# Patient Record
Sex: Male | Born: 1965 | Race: Black or African American | Hispanic: No | Marital: Married | Smoking: Never smoker
Health system: Southern US, Community
[De-identification: ages and names within clinical notes are randomized; demographics above are authoritative.]

## PROBLEM LIST (undated history)

## (undated) DIAGNOSIS — I10 Essential (primary) hypertension: Secondary | ICD-10-CM

## (undated) HISTORY — PX: KNEE SURGERY: SHX244

---

## 2016-10-31 ENCOUNTER — Emergency Department (HOSPITAL_COMMUNITY): Payer: BLUE CROSS/BLUE SHIELD

## 2016-10-31 ENCOUNTER — Emergency Department (HOSPITAL_COMMUNITY)
Admission: EM | Admit: 2016-10-31 | Discharge: 2016-11-01 | Disposition: A | Discharge status: Home / Self Care | Payer: BLUE CROSS/BLUE SHIELD | Attending: Emergency Medicine | Admitting: Emergency Medicine

## 2016-10-31 ENCOUNTER — Encounter (HOSPITAL_COMMUNITY): Payer: Self-pay

## 2016-10-31 ENCOUNTER — Emergency Department (HOSPITAL_COMMUNITY)
Admission: EM | Admit: 2016-10-31 | Discharge: 2016-10-31 | Disposition: A | Discharge status: Home / Self Care | Payer: Self-pay | Attending: Emergency Medicine | Admitting: Emergency Medicine

## 2016-10-31 ENCOUNTER — Encounter (HOSPITAL_COMMUNITY): Payer: Self-pay | Admitting: Emergency Medicine

## 2016-10-31 DIAGNOSIS — R791 Abnormal coagulation profile: Secondary | ICD-10-CM | POA: Insufficient documentation

## 2016-10-31 DIAGNOSIS — R200 Anesthesia of skin: Secondary | ICD-10-CM | POA: Insufficient documentation

## 2016-10-31 DIAGNOSIS — Z5321 Procedure and treatment not carried out due to patient leaving prior to being seen by health care provider: Secondary | ICD-10-CM | POA: Insufficient documentation

## 2016-10-31 DIAGNOSIS — R062 Wheezing: Secondary | ICD-10-CM | POA: Insufficient documentation

## 2016-10-31 DIAGNOSIS — R52 Pain, unspecified: Secondary | ICD-10-CM | POA: Diagnosis not present

## 2016-10-31 DIAGNOSIS — Z7982 Long term (current) use of aspirin: Secondary | ICD-10-CM | POA: Insufficient documentation

## 2016-10-31 DIAGNOSIS — R930 Abnormal findings on diagnostic imaging of skull and head, not elsewhere classified: Secondary | ICD-10-CM | POA: Diagnosis not present

## 2016-10-31 DIAGNOSIS — R6889 Other general symptoms and signs: Secondary | ICD-10-CM

## 2016-10-31 DIAGNOSIS — I10 Essential (primary) hypertension: Secondary | ICD-10-CM | POA: Insufficient documentation

## 2016-10-31 DIAGNOSIS — R110 Nausea: Secondary | ICD-10-CM | POA: Insufficient documentation

## 2016-10-31 DIAGNOSIS — R109 Unspecified abdominal pain: Secondary | ICD-10-CM | POA: Insufficient documentation

## 2016-10-31 HISTORY — DX: Essential (primary) hypertension: I10

## 2016-10-31 LAB — I-STAT CHEM 8, ED
BUN: 11 mg/dL (ref 6–20)
CALCIUM ION: 1.13 mmol/L — AB (ref 1.15–1.40)
CHLORIDE: 100 mmol/L — AB (ref 101–111)
Creatinine, Ser: 1.1 mg/dL (ref 0.61–1.24)
Glucose, Bld: 107 mg/dL — ABNORMAL HIGH (ref 65–99)
HCT: 43 % (ref 39.0–52.0)
Hemoglobin: 14.6 g/dL (ref 13.0–17.0)
Potassium: 3.9 mmol/L (ref 3.5–5.1)
Sodium: 140 mmol/L (ref 135–145)
TCO2: 30 mmol/L (ref 0–100)

## 2016-10-31 LAB — COMPREHENSIVE METABOLIC PANEL
ALBUMIN: 4.4 g/dL (ref 3.5–5.0)
ALT: 20 U/L (ref 17–63)
ALT: 20 U/L (ref 17–63)
ANION GAP: 12 (ref 5–15)
AST: 22 U/L (ref 15–41)
AST: 20 U/L (ref 15–41)
Albumin: 4.3 g/dL (ref 3.5–5.0)
Alkaline Phosphatase: 58 U/L (ref 38–126)
Alkaline Phosphatase: 56 U/L (ref 38–126)
Anion gap: 8 (ref 5–15)
BUN: 9 mg/dL (ref 6–20)
BUN: 13 mg/dL (ref 6–20)
CHLORIDE: 101 mmol/L (ref 101–111)
CHLORIDE: 103 mmol/L (ref 101–111)
CO2: 25 mmol/L (ref 22–32)
CO2: 28 mmol/L (ref 22–32)
CREATININE: 1.08 mg/dL (ref 0.61–1.24)
Calcium: 9.1 mg/dL (ref 8.9–10.3)
Calcium: 8.9 mg/dL (ref 8.9–10.3)
Creatinine, Ser: 1.14 mg/dL (ref 0.61–1.24)
GFR calc Af Amer: >60 mL/min (ref >60)
GFR calc non Af Amer: >60 mL/min (ref >60)
GFR calc non Af Amer: >60 mL/min (ref >60)
GLUCOSE: 107 mg/dL — AB (ref 65–99)
Glucose, Bld: 144 mg/dL — ABNORMAL HIGH (ref 65–99)
POTASSIUM: 3.3 mmol/L — AB (ref 3.5–5.1)
Potassium: 3.8 mmol/L (ref 3.5–5.1)
Sodium: 138 mmol/L (ref 135–145)
Sodium: 139 mmol/L (ref 135–145)
Total Bilirubin: 0.8 mg/dL (ref 0.3–1.2)
Total Bilirubin: 1.3 mg/dL — ABNORMAL HIGH (ref 0.3–1.2)
Total Protein: 7.1 g/dL (ref 6.5–8.1)
Total Protein: 7.1 g/dL (ref 6.5–8.1)

## 2016-10-31 LAB — CBC
HEMATOCRIT: 41 % (ref 39.0–52.0)
HEMATOCRIT: 39.4 % (ref 39.0–52.0)
HEMOGLOBIN: 13.5 g/dL (ref 13.0–17.0)
Hemoglobin: 13.9 g/dL (ref 13.0–17.0)
MCH: 29.8 pg (ref 26.0–34.0)
MCH: 30 pg (ref 26.0–34.0)
MCHC: 33.9 g/dL (ref 30.0–36.0)
MCHC: 34.3 g/dL (ref 30.0–36.0)
MCV: 88 fL (ref 78.0–100.0)
MCV: 87.6 fL (ref 78.0–100.0)
Platelets: 303 10*3/uL (ref 150–400)
Platelets: 288 10*3/uL (ref 150–400)
RBC: 4.66 MIL/uL (ref 4.22–5.81)
RBC: 4.5 MIL/uL (ref 4.22–5.81)
RDW: 12.9 % (ref 11.5–15.5)
RDW: 13.1 % (ref 11.5–15.5)
WBC: 9.9 10*3/uL (ref 4.0–10.5)
WBC: 7.4 10*3/uL (ref 4.0–10.5)

## 2016-10-31 LAB — I-STAT TROPONIN, ED: TROPONIN I, POC: 0 ng/mL (ref 0.00–0.08)

## 2016-10-31 LAB — DIFFERENTIAL
BASOS ABS: 0 10*3/uL (ref 0.0–0.1)
BASOS PCT: 0 %
Eosinophils Absolute: 0 10*3/uL (ref 0.0–0.7)
Eosinophils Relative: 1 %
LYMPHS ABS: 0.6 10*3/uL — AB (ref 0.7–4.0)
Lymphocytes Relative: 8 %
MONOS PCT: 8 %
Monocytes Absolute: 0.6 10*3/uL (ref 0.1–1.0)
NEUTROS ABS: 6.1 10*3/uL (ref 1.7–7.7)
NEUTROS PCT: 83 %

## 2016-10-31 LAB — APTT: aPTT: 27 seconds (ref 24–36)

## 2016-10-31 LAB — PROTIME-INR
INR: 0.98
Prothrombin Time: 13 seconds (ref 11.4–15.2)

## 2016-10-31 LAB — LIPASE, BLOOD: LIPASE: 21 U/L (ref 11–51)

## 2016-10-31 MED ORDER — ACETAMINOPHEN 325 MG PO TABS
650.0000 mg | ORAL_TABLET | Freq: Once | ORAL | Status: AC
  Administered 2016-10-31: 650 mg via ORAL
  Filled 2016-10-31: qty 2

## 2016-10-31 MED ORDER — SODIUM CHLORIDE 0.9 % IV BOLUS (SEPSIS): 1000.0000 mL | Freq: Once | INTRAVENOUS | Status: DC

## 2016-10-31 MED ORDER — ONDANSETRON 4 MG PO TBDP
ORAL_TABLET | ORAL | Status: AC
  Filled 2016-10-31: qty 1

## 2016-10-31 MED ORDER — IPRATROPIUM-ALBUTEROL 0.5-2.5 (3) MG/3ML IN SOLN
3.0000 mL | Freq: Once | RESPIRATORY_TRACT | Status: AC
  Administered 2016-10-31: 3 mL via RESPIRATORY_TRACT
  Filled 2016-10-31: qty 3

## 2016-10-31 MED ORDER — ONDANSETRON 4 MG PO TBDP
4.0000 mg | ORAL_TABLET | Freq: Once | ORAL | Status: AC | PRN
  Administered 2016-10-31: 4 mg via ORAL

## 2016-10-31 NOTE — ED Notes (Signed)
Pt states his wife is going to take him home due to wait times; pt was informed that if he feels he is getting worse to come back; pt seen leaving Ed with family

## 2016-10-31 NOTE — ED Notes (Signed)
Spoke with Ranae PalmsYelverton; made aware of pt status/complaint.

## 2016-10-31 NOTE — ED Triage Notes (Signed)
Pt complaining of numbness in bilateral hands. Pt with no focal neuro deficits at triage. Pt complaining of abdominal pain and nausea x 30 mins. Pt denies any chest pain. Pt denies any vomiting or diarrhea.

## 2016-10-31 NOTE — ED Triage Notes (Addendum)
Pt complaint of sudden onset slurred speech, unable to straighten fingers, left side facial droop, chills, nausea and generalized aches at 0200 today. Pt febrile with triage and verbalizes generalized aches/weakness and nausea. Pt denies numbness. Pt/wife verbalizes speech, inability to straighten fingers, and left sided facial droop  has since resolved. [facility] but left related to wait time.

## 2016-10-31 NOTE — ED Provider Notes (Signed)
[facility]-EMERGENCY DEPT Provider Note   CSN: [id number] Arrival date & time: 10/31/16  1054 By signing my name below, I, Levon HedgerElizabeth Hall, attest that this documentation has been prepared under the direction and in the presence of non-physician practitioner, Terance HartKelly Markita Stcharles, PA-C. Electronically Signed: Levon HedgerElizabeth Hall, Scribe. 10/31/2016. 10:05 PM.   History   Chief Complaint Chief Complaint  Patient presents with  . Aphasia  . Flu Like Symptoms   HPI Brian Carter is a 51 y.o. male with a hx of HTN who presents to the Emergency Department complaining of sudden onset, constant generalized body aches which began this morning at 2 am. Pt reports associated intermittent cough, SOB,  nausea,  headache, subjective fever (TMAX 100.2), and chills. [facility] and was given Zofran while in triage with relief of nausea; he left the ED without being seen by a provider due to wait times. No other alleviating or modifying factors noted.   Pt also complains of sudden onset, transient left sided facial asymmetry onset at 2 this AM. He reports associated numbness and tingling to bilateral hands, slurred speech, and lightheadedness. Pt's wife states these symptoms lasted about 20 minutes before resolving on their own. No alleviating or modifying factors noted.   He is a nonsmoker. Pt reports positive sick contact at home with his wife who had flu-like symptoms recently. He did not receive a flu shot this year. He is not followed by a PCP. Pt denies any vomiting, diarrhea, constipation, visual disturbance, gait difficulty or LOC.   The history is provided by the patient. No language interpreter was used.   Past Medical History:  Diagnosis Date  . Hypertension     There are no active problems to display for this patient.   History reviewed. No pertinent surgical history.  Home Medications    Prior to Admission medications   Medication Sig Start Date End Date Taking? Authorizing Provider    Aspirin-Salicylamide-Caffeine (BC HEADACHE POWDER PO) Take 1 packet by mouth daily as needed (headache).   Yes Historical Provider, MD   Family History No family history on file.  Social History Social History  Substance Use Topics  . Smoking status: Never Smoker  . Smokeless tobacco: Current User    Types: Snuff  . Alcohol use Yes    Allergies   Patient has no known allergies.   Review of Systems Review of Systems  Constitutional: Positive for chills and fever.  Eyes: Negative for visual disturbance.  Respiratory: Positive for cough.   Gastrointestinal: Positive for nausea. Negative for constipation, diarrhea and vomiting.  Musculoskeletal: Negative for gait problem.  Neurological: Positive for facial asymmetry, speech difficulty, weakness, light-headedness and headaches. Negative for syncope.  All other systems reviewed and are negative.  Physical Exam Updated Vital Signs BP 164/96 (BP Location: Left Arm)   Pulse 103   Temp 100 F (37.8 C) (Oral)   Resp 18   Ht 5\' 9"  (1.753 m)   Wt 189 lb (85.7 kg)   SpO2 94%   BMI 27.91 kg/m   Physical Exam  Constitutional: He is oriented to person, place, and time. He appears well-developed and well-nourished. No distress.  HENT:  Head: Normocephalic and atraumatic.  Right Ear: Hearing, tympanic membrane, external ear and ear canal normal.  Left Ear: Hearing, tympanic membrane, external ear and ear canal normal.  Nose: Nose normal.  Mouth/Throat: Uvula is midline, oropharynx is clear and moist and mucous membranes are normal.  Eyes: Conjunctivae are normal. Pupils are  equal, round, and reactive to light. Right eye exhibits no discharge. Left eye exhibits no discharge. No scleral icterus.  Neck: Normal range of motion.  Cardiovascular: Normal rate and regular rhythm.  Exam reveals no gallop and no friction rub.   No murmur heard. Pulmonary/Chest: Effort normal. No respiratory distress. He has wheezes. He has no rales. He  exhibits no tenderness.  Diffuse expiratory wheezes  Abdominal: Soft. Bowel sounds are normal. He exhibits no distension and no mass. There is tenderness. There is no rebound and no guarding. No hernia.  Mild generalized tenderness  Neurological: He is alert and oriented to person, place, and time.  Mental Status:  Alert, oriented, thought content appropriate, able to give a coherent history. Speech fluent without evidence of aphasia. Able to follow 2 step commands without difficulty.  Cranial Nerves:  II:  Peripheral visual fields grossly normal, pupils equal, round, reactive to light III,IV, VI: ptosis not present, extra-ocular motions intact bilaterally  V,VII: smile symmetric, facial light touch sensation equal VIII: hearing grossly normal to voice  X: uvula elevates symmetrically  XI: bilateral shoulder shrug symmetric and strong XII: midline tongue extension without fassiculations Motor:  Normal tone. 5/5 in upper and lower extremities bilaterally including strong and equal grip strength and dorsiflexion/plantar flexion Sensory: Pinprick and light touch normal in all extremities.  Cerebellar: normal finger-to-nose with bilateral upper extremities Gait: normal gait and balance CV: distal pulses palpable throughout    Skin: Skin is warm and dry.  Psychiatric: He has a normal mood and affect. His behavior is normal.  Nursing note and vitals reviewed.   ED Treatments / Results  DIAGNOSTIC STUDIES:  Oxygen Saturation is 94% on RA, low by my interpretation.    COORDINATION OF CARE:  10:00 PM Discussed treatment plan which includes CXR and breathing treatment with pt at bedside and pt agreed to plan.   Labs (all labs ordered are listed, but only abnormal results are displayed) Labs Reviewed  DIFFERENTIAL - Abnormal; Notable for the following:       Result Value   Lymphs Abs 0.6 (*)    All other components within normal limits  COMPREHENSIVE METABOLIC PANEL - Abnormal;  Notable for the following:    Glucose, Bld 107 (*)    Total Bilirubin 1.3 (*)    All other components within normal limits  I-STAT CHEM 8, ED - Abnormal; Notable for the following:    Chloride 100 (*)    Glucose, Bld 107 (*)    Calcium, Ion 1.13 (*)    All other components within normal limits  PROTIME-INR  APTT  CBC  I-STAT TROPOININ, ED    EKG  EKG Interpretation  Date/Time:  Monday October 31 2016 11:31:10 EST Ventricular Rate:  79 PR Interval:    QRS Duration: 92 QT Interval:  375 QTC Calculation: 430 R Axis:   87 Text Interpretation:  Sinus rhythm Probable LVH with secondary repol abnrm Baseline wander in lead(s) V1 V3 Confirmed by Ranae Palms  MD, DAVID ([id number]) on 10/31/2016 11:40:20 AM      Radiology Ct Head Wo Contrast  Result Date: 10/31/2016 CLINICAL DATA:  Frontal headache since yesterday. Bilateral hand weakness. EXAM: CT HEAD WITHOUT CONTRAST TECHNIQUE: Contiguous axial images were obtained from the base of the skull through the vertex without intravenous contrast. COMPARISON:  None. FINDINGS: Brain: No acute intracranial abnormality. Specifically, no hemorrhage, hydrocephalus, mass lesion, acute infarction, or significant intracranial injury. Vascular: No hyperdense vessel or unexpected calcification. Skull: No acute calvarial abnormality.  Sinuses/Orbits: Visualized paranasal sinuses and mastoids clear. Orbital soft tissues unremarkable. Other: None IMPRESSION: No intracranial abnormality. Electronically Signed   By: Charlett Nose M.D.   On: 10/31/2016 11:43   Procedures Procedures (including critical care time)  Medications Ordered in ED Medications  acetaminophen (TYLENOL) tablet 650 mg (650 mg Oral Given 10/31/16 2217)  ipratropium-albuterol (DUONEB) 0.5-2.5 (3) MG/3ML nebulizer solution 3 mL (3 mLs Nebulization Given 10/31/16 2221)    Initial Impression / Assessment and Plan / ED Course  I have reviewed the triage vital signs and the nursing notes.  Pertinent  labs & imaging results that were available during my care of the patient were reviewed by me and considered in my medical decision making (see chart for details).  51 year old male with flu-like symptoms and transient neuro symptoms which have resolved. He is febrile in the ED with documented fever of 101.5. He is also mildly tachcyardic which has resolved with Tylenol. BP is persistently elevated. Suspect some degree of untreated hypertension. He is not hypoxic. Labs overall unremarkable. CXR clear. EKG is SR. Troponin negative. CT negative. Duoneb given and repeat lung exam is improved. Neuro symptoms are atypical and possibly he was just very weak from being ill. Will d/c with close PCP follow up. Patient is NAD, non-toxic, with stable VS. Patient is informed of clinical course, understands medical decision making process, and agrees with plan. Opportunity for questions provided and all questions answered. Return precautions given.   Final Clinical Impressions(s) / ED Diagnoses   Final diagnoses:  Flu-like symptoms    New Prescriptions Discharge Medication List as of 11/01/2016 12:26 AM    START taking these medications   Details  albuterol (PROVENTIL HFA;VENTOLIN HFA) 108 (90 Base) MCG/ACT inhaler Inhale 1-2 puffs into the lungs every 6 (six) hours as needed for wheezing or shortness of breath., Starting Tue 11/01/2016, Print    ondansetron (ZOFRAN ODT) 4 MG disintegrating tablet Take 1 tablet (4 mg total) by mouth every 8 (eight) hours as needed for nausea or vomiting., Starting Tue 11/01/2016, Print       I personally performed the services described in this documentation, which was scribed in my presence. The recorded information has been reviewed and is accurate.    Bethel Born, PA-C 11/01/16 2059    Mancel Bale, MD 11/02/16 [phone]

## 2016-11-01 MED ORDER — ONDANSETRON 4 MG PO TBDP: 4.0000 mg | ORAL_TABLET | Freq: Three times a day (TID) | ORAL | 0 refills | Status: DC | PRN

## 2016-11-01 MED ORDER — ALBUTEROL SULFATE HFA 108 (90 BASE) MCG/ACT IN AERS: 1.0000 | INHALATION_SPRAY | Freq: Four times a day (QID) | RESPIRATORY_TRACT | 0 refills | Status: DC | PRN

## 2016-11-01 NOTE — Discharge Instructions (Signed)
Please follow up with a primary care doctor regarding your blood pressure Take Tylenol for fever Zofran for nausea Albuterol for shortness of breath/wheezing Return for worsening symptoms

## 2017-03-29 ENCOUNTER — Other Ambulatory Visit: Payer: Self-pay | Admitting: Internal Medicine

## 2017-03-29 ENCOUNTER — Ambulatory Visit
Admission: RE | Admit: 2017-03-29 | Discharge: 2017-03-29 | Disposition: A | Discharge status: Home / Self Care | Payer: BLUE CROSS/BLUE SHIELD | Source: Ambulatory Visit | Attending: Internal Medicine | Admitting: Internal Medicine

## 2017-03-29 DIAGNOSIS — M25522 Pain in left elbow: Secondary | ICD-10-CM

## 2018-03-20 IMAGING — CT CT HEAD W/O CM
3 series · 15 of 44 positions shown, 18 images · non-contrast
Comparison: None.

CLINICAL DATA: Frontal headache since yesterday. Bilateral hand
weakness.

EXAM:
CT HEAD WITHOUT CONTRAST
TECHNIQUE: Contiguous axial images were obtained from the base of the skull
through the vertex without intravenous contrast.

[Series 2: head wo · axial · 0.39mm/px · z∈[-86,+24]mm · 9 of 27 slices shown, 12 images]
[im 3/27  brain]
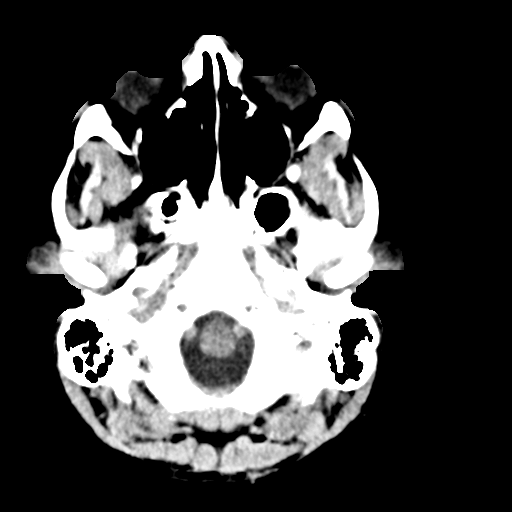
[im 3/27  bone]
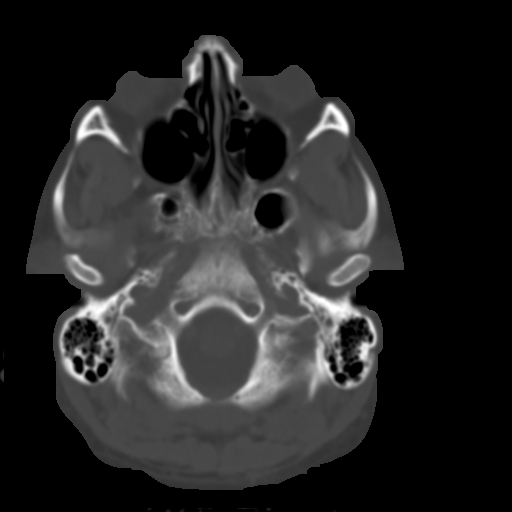
[im 6/27  brain]
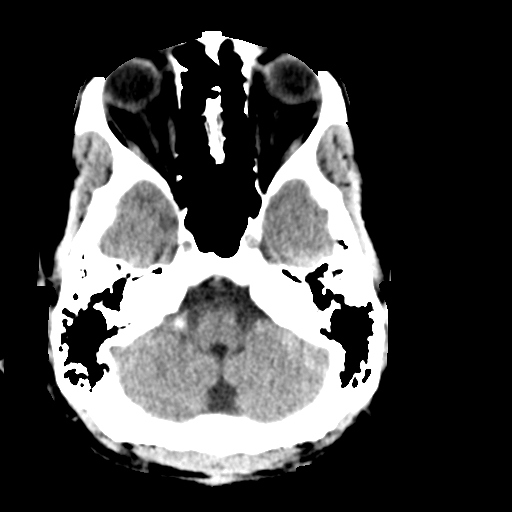
[im 8/27  brain]
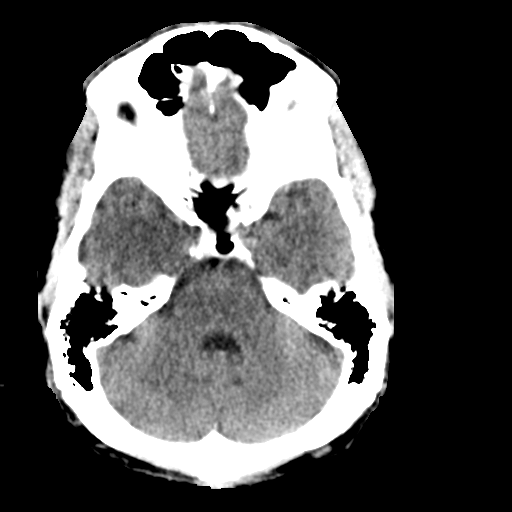
[im 11/27  brain]
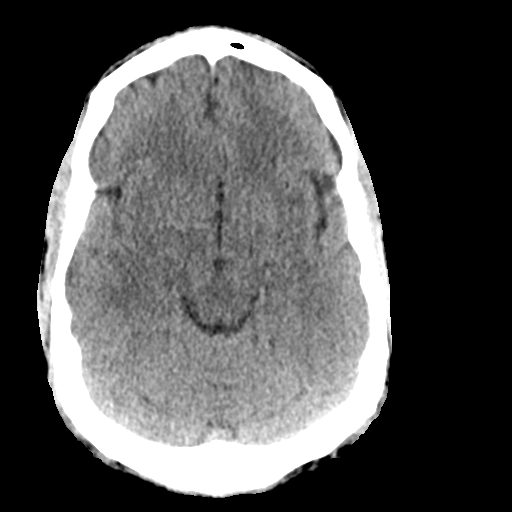
[im 14/27  brain]
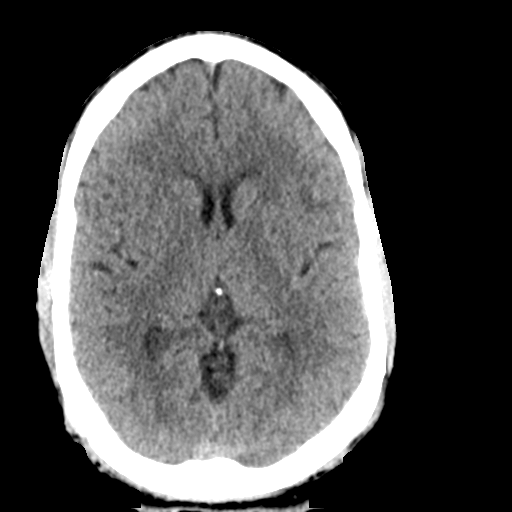
[im 14/27  bone]
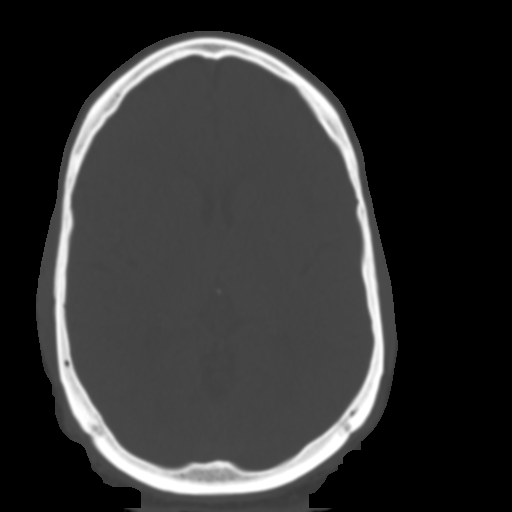
[im 17/27  brain]
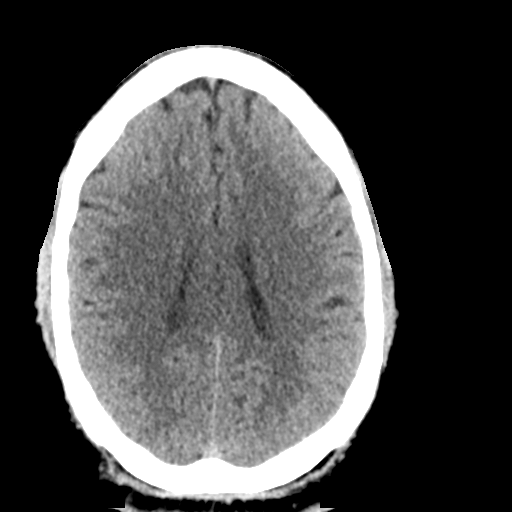
[im 20/27  brain]
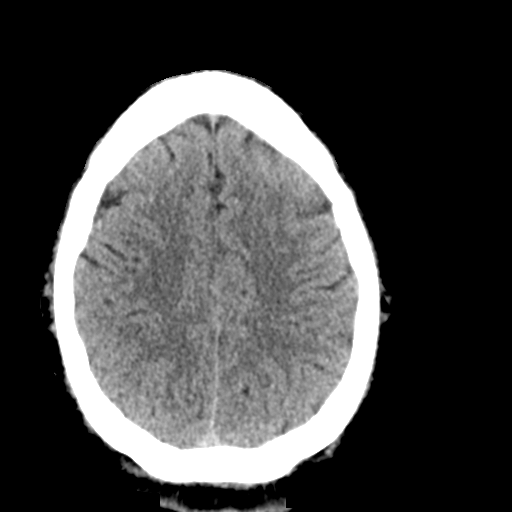
[im 22/27  brain]
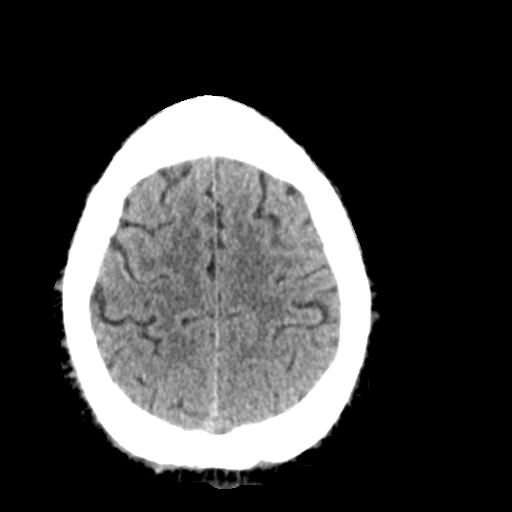
[im 25/27  brain]
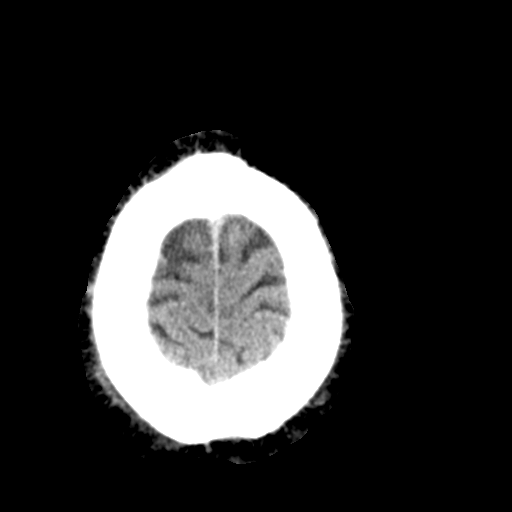
[im 25/27  bone]
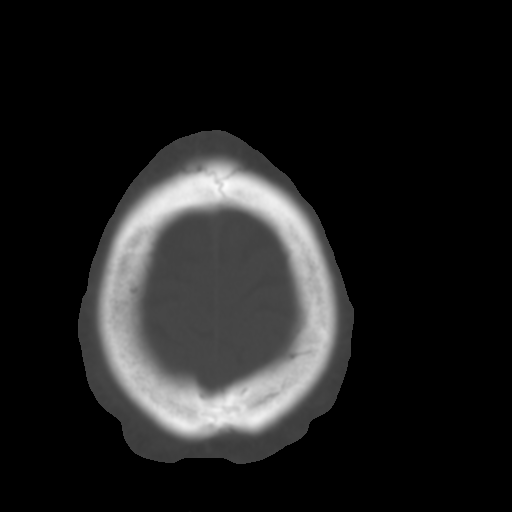

[Series 4: coronal soft tissue · coronal · 0.28mm/px · 3 of 67 slices shown]
[im 23/67  brain]
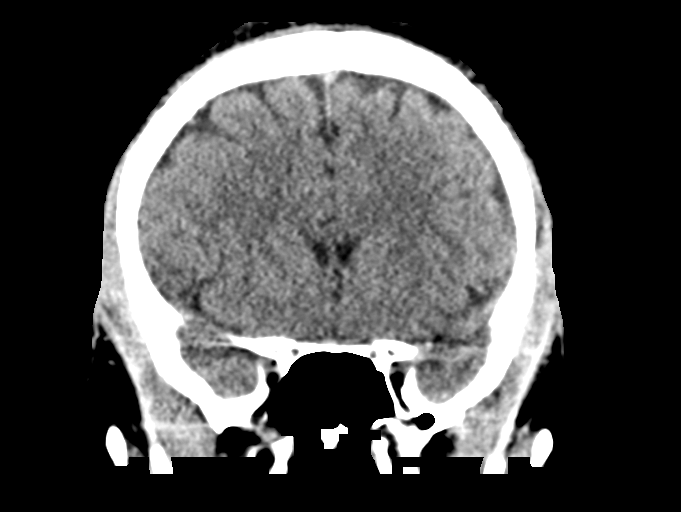
[im 30/67  brain]
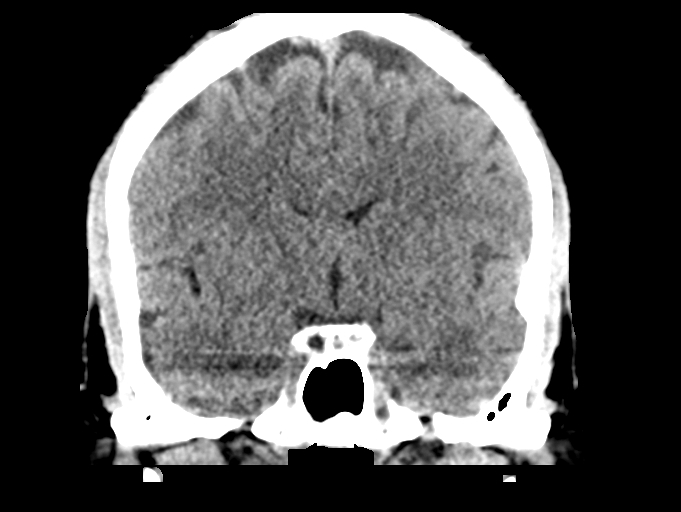
[im 37/67  brain]
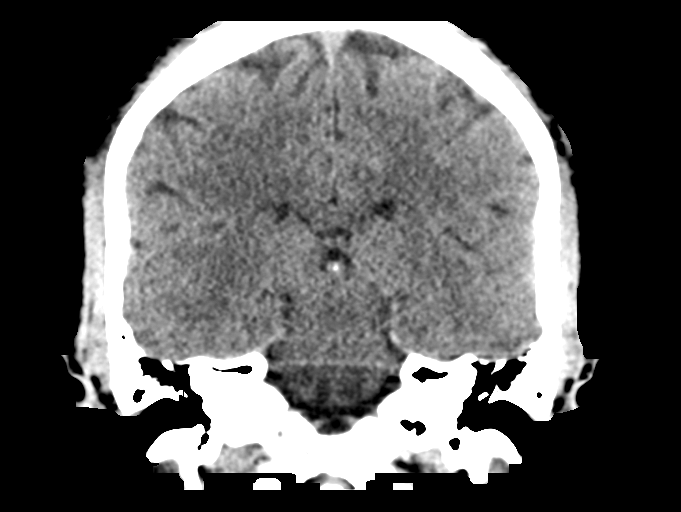

[Series 5: sagittal soft tissue · sagittal · 0.29mm/px · 3 of 58 slices shown]
[im 20/58  brain]
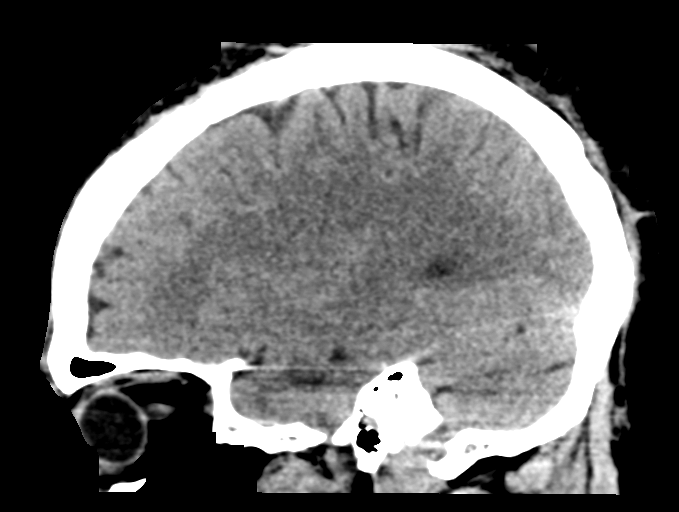
[im 29/58  brain]
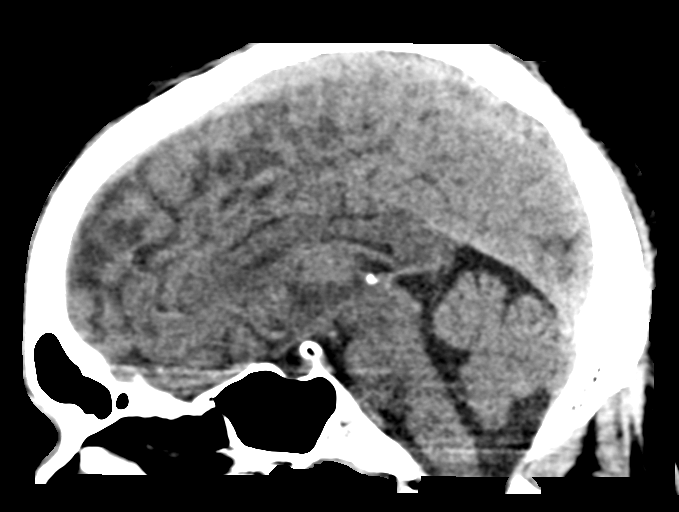
[im 39/58  brain]
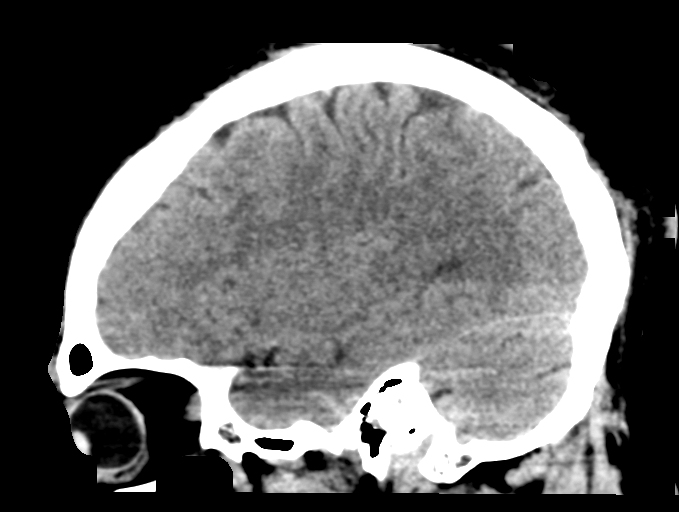

[15 of 44 positions shown; findings below may reference images not displayed]

FINDINGS: Brain: No acute intracranial abnormality. Specifically, no
hemorrhage, hydrocephalus, mass lesion, acute infarction, or
significant intracranial injury.

Vascular: No hyperdense vessel or unexpected calcification.

Skull: No acute calvarial abnormality.

Sinuses/Orbits: Visualized paranasal sinuses and mastoids clear.
Orbital soft tissues unremarkable.

Other: None
IMPRESSION: No intracranial abnormality.

## 2018-03-30 ENCOUNTER — Encounter: Payer: Self-pay | Admitting: Internal Medicine

## 2018-04-16 ENCOUNTER — Other Ambulatory Visit: Payer: Self-pay

## 2018-04-16 ENCOUNTER — Ambulatory Visit (AMBULATORY_SURGERY_CENTER): Payer: Self-pay

## 2018-04-16 VITALS — Ht 70.0 in | Wt 202.2 lb

## 2018-04-16 DIAGNOSIS — Z1211 Encounter for screening for malignant neoplasm of colon: Secondary | ICD-10-CM

## 2018-04-16 MED ORDER — NA SULFATE-K SULFATE-MG SULF 17.5-3.13-1.6 GM/177ML PO SOLN: 1.0000 | Freq: Once | ORAL | 0 refills | Status: AC

## 2018-04-16 NOTE — Progress Notes (Signed)
Denies allergies to eggs or soy products. Denies complication of anesthesia or sedation. Denies use of weight loss medication. Denies use of O2.   Emmi instructions declined.  

## 2018-04-23 ENCOUNTER — Encounter: Payer: Self-pay | Admitting: Internal Medicine

## 2018-04-30 ENCOUNTER — Ambulatory Visit (AMBULATORY_SURGERY_CENTER): Payer: Managed Care, Other (non HMO) | Admitting: Internal Medicine

## 2018-04-30 ENCOUNTER — Encounter: Payer: Self-pay | Admitting: Internal Medicine

## 2018-04-30 VITALS — BP 121/83 | HR 53 | Temp 97.5°F | Resp 18 | Ht 70.0 in | Wt 202.0 lb

## 2018-04-30 DIAGNOSIS — Z1211 Encounter for screening for malignant neoplasm of colon: Secondary | ICD-10-CM

## 2018-04-30 MED ORDER — SODIUM CHLORIDE 0.9 % IV SOLN: 500.0000 mL | Freq: Once | INTRAVENOUS | Status: AC

## 2018-04-30 NOTE — Progress Notes (Signed)
Pt's states no medical or surgical changes since previsit or office visit. 

## 2018-04-30 NOTE — Op Note (Signed)
[facility] Patient Name: Brian Carter Procedure Date: 04/30/2018 9:39 AM MRN: [id number] Endoscopist: Wilhemina Bonito. Marina Goodell , MD Age: 52 Referring MD:  Date of Birth: 22-Sep-1966 Gender: Male Account #: [id number] Procedure:                Colonoscopy Indications:              Screening for colorectal malignant neoplasm Medicines:                Monitored Anesthesia Care Procedure:                Pre-Anesthesia Assessment:                           - Prior to the procedure, a History and Physical                            was performed, and patient medications and                            allergies were reviewed. The patient's tolerance of                            previous anesthesia was also reviewed. The risks                            and benefits of the procedure and the sedation                            options and risks were discussed with the patient.                            All questions were answered, and informed consent                            was obtained. Prior Anticoagulants: The patient has                            taken no previous anticoagulant or antiplatelet                            agents. ASA Grade Assessment: II - A patient with                            mild systemic disease. After reviewing the risks                            and benefits, the patient was deemed in                            satisfactory condition to undergo the procedure.                           After obtaining informed consent, the colonoscope  was passed under direct vision. Throughout the                            procedure, the patient's blood pressure, pulse, and                            oxygen saturations were monitored continuously. The                            Model CF-HQ190L [id number] scope was introduced                            through the anus and advanced to the the cecum,                            identified by  appendiceal orifice and ileocecal                            valve. The ileocecal valve, appendiceal orifice,                            and rectum were photographed. The quality of the                            bowel preparation was excellent. The colonoscopy                            was performed without difficulty. The patient                            tolerated the procedure well. The bowel preparation                            used was SUPREP. Scope In: 9:46:02 AM Scope Out: 9:56:50 AM Scope Withdrawal Time: 0 hours 8 minutes 56 seconds  Total Procedure Duration: 0 hours 10 minutes 48 seconds  Findings:                 A few medium-mouthed diverticula were found in the                            sigmoid colon and ascending colon.                           The exam was otherwise without abnormality on                            direct and retroflexion views. Complications:            No immediate complications. Estimated blood loss:                            None. Estimated Blood Loss:     Estimated blood loss: none. Impression:               - Diverticulosis in the  sigmoid colon and in the                            ascending colon.                           - The examination was otherwise normal on direct                            and retroflexion views.                           - No specimens collected. Recommendation:           - Repeat colonoscopy in 10 years for screening                            purposes.                           - Patient has a contact number available for                            emergencies. The signs and symptoms of potential                            delayed complications were discussed with the                            patient. Return to normal activities tomorrow.                            Written discharge instructions were provided to the                            patient.                           - Resume previous diet.                            - Continue present medications. Wilhemina BonitoJohn N. Marina GoodellPerry, MD 04/30/2018 10:00:04 AM This report has been signed electronically.

## 2018-04-30 NOTE — Progress Notes (Signed)
A/ox3 pleased with MAC, report to RN 

## 2018-04-30 NOTE — Patient Instructions (Signed)
*   handout on diverticulosis given*  YOU HAD AN ENDOSCOPIC PROCEDURE TODAY AT THE [facility]:   Refer to the procedure report that was given to you for any specific questions about what was found during the examination.  If the procedure report does not answer your questions, please call your gastroenterologist to clarify.  If you requested that your care partner not be given the details of your procedure findings, then the procedure report has been included in a sealed envelope for you to review at your convenience later.  YOU SHOULD EXPECT: Some feelings of bloating in the abdomen. Passage of more gas than usual.  Walking can help get rid of the air that was put into your GI tract during the procedure and reduce the bloating. If you had a lower endoscopy (such as a colonoscopy or flexible sigmoidoscopy) you may notice spotting of blood in your stool or on the toilet paper. If you underwent a bowel prep for your procedure, you may not have a normal bowel movement for a few days.  Please Note:  You might notice some irritation and congestion in your nose or some drainage.  This is from the oxygen used during your procedure.  There is no need for concern and it should clear up in a day or so.  SYMPTOMS TO REPORT IMMEDIATELY:   Following lower endoscopy (colonoscopy or flexible sigmoidoscopy):  Excessive amounts of blood in the stool  Significant tenderness or worsening of abdominal pains  Swelling of the abdomen that is new, acute  Fever of 100F or higher   For urgent or emergent issues, a gastroenterologist can be reached at any hour by calling [phone].   DIET:  We do recommend a small meal at first, but then you may proceed to your regular diet.  Drink plenty of fluids but you should avoid alcoholic beverages for 24 hours.  ACTIVITY:  You should plan to take it easy for the rest of today and you should NOT DRIVE or use heavy machinery until tomorrow (because of the  sedation medicines used during the test).    FOLLOW UP: Our staff will call the number listed on your records the next business day following your procedure to check on you and address any questions or concerns that you may have regarding the information given to you following your procedure. If we do not reach you, we will leave a message.  However, if you are feeling well and you are not experiencing any problems, there is no need to return our call.  We will assume that you have returned to your regular daily activities without incident.  If any biopsies were taken you will be contacted by phone or by letter within the next 1-3 weeks.  Please call us at [phone] if you have not heard about the biopsies in 3 weeks.    SIGNATURES/CONFIDENTIALITY: You and/or your care partner have signed paperwork which will be entered into your electronic medical record.  These signatures attest to the fact that that the information above on your After Visit Summary has been reviewed and is understood.  Full responsibility of the confidentiality of this discharge information lies with you and/or your care-partner. 

## 2018-05-01 ENCOUNTER — Telehealth: Payer: Self-pay | Admitting: *Deleted

## 2018-05-01 NOTE — Telephone Encounter (Signed)
  Follow up Call-  Call back number 04/30/2018  Post procedure Call Back phone  # [phone]  Permission to leave phone message Yes  Some recent data might be hidden     Patient questions:  Do you have a fever, pain , or abdominal swelling? No. Pain Score  0 *  Have you tolerated food without any problems? Yes.    Have you been able to return to your normal activities? Yes.    Do you have any questions about your discharge instructions: Diet   No. Medications  No. Follow up visit  No.  Do you have questions or concerns about your Care? No.  Actions: * If pain score is 4 or above: No action needed, pain <4.
